# Patient Record
Sex: Male | Born: 2005 | Race: White | Hispanic: No | Marital: Single | Smoking: Never smoker
Health system: Southern US, Community
[De-identification: ages and names within clinical notes are randomized; demographics above are authoritative.]

---
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
## 2024-01-30 IMAGING — DX DG FOOT COMPLETE 3+V*R*
3 series · 3 of 3 positions shown · non-contrast
Comparison: None.

CLINICAL DATA: Rolled foot, pain

EXAM:
RIGHT FOOT COMPLETE - 3+ VIEW

[foot ap]
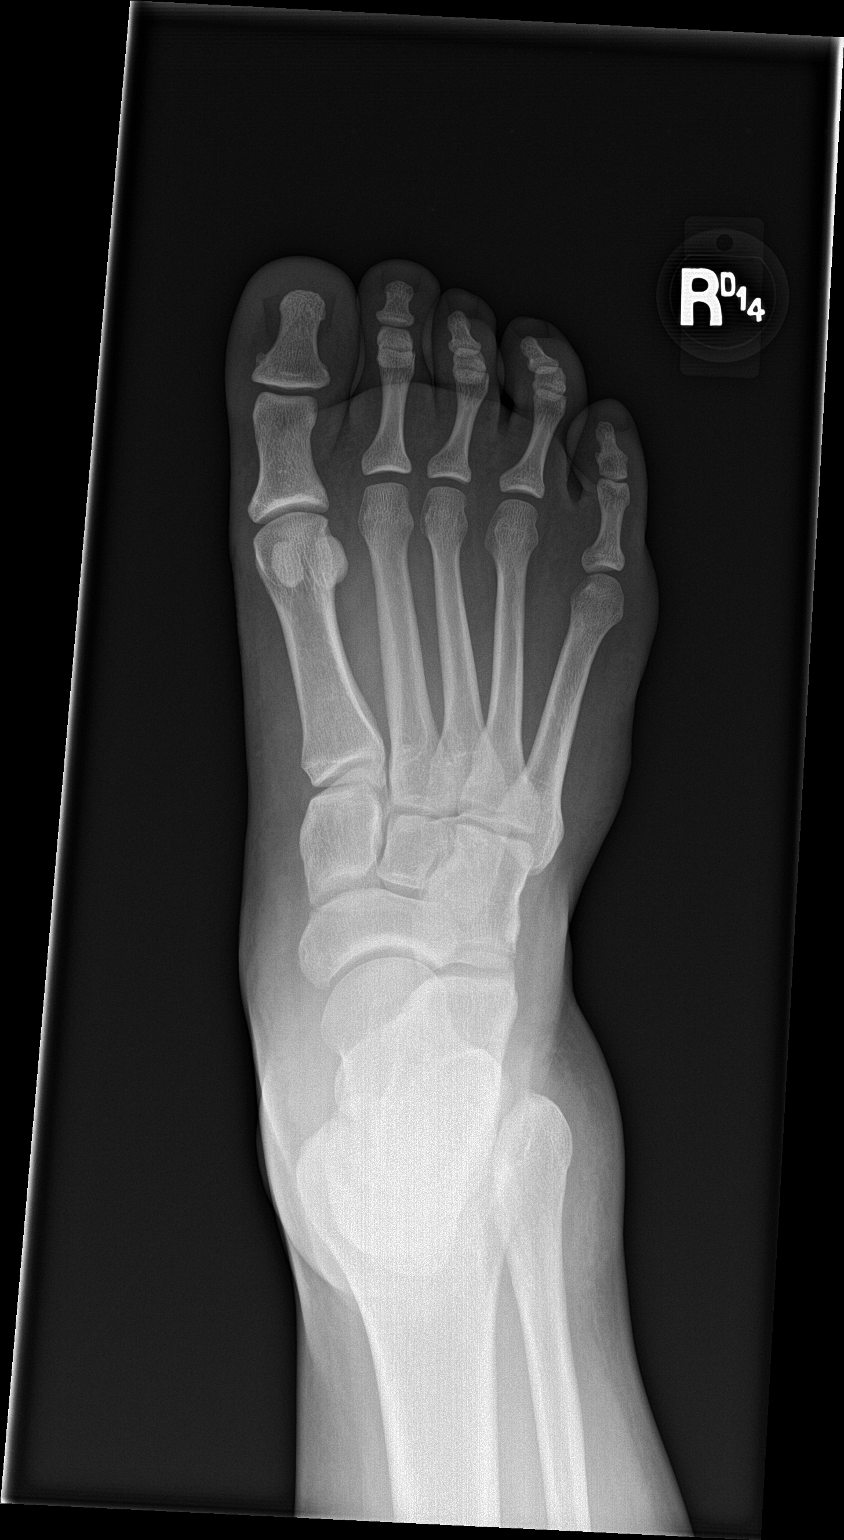

[foot obl]
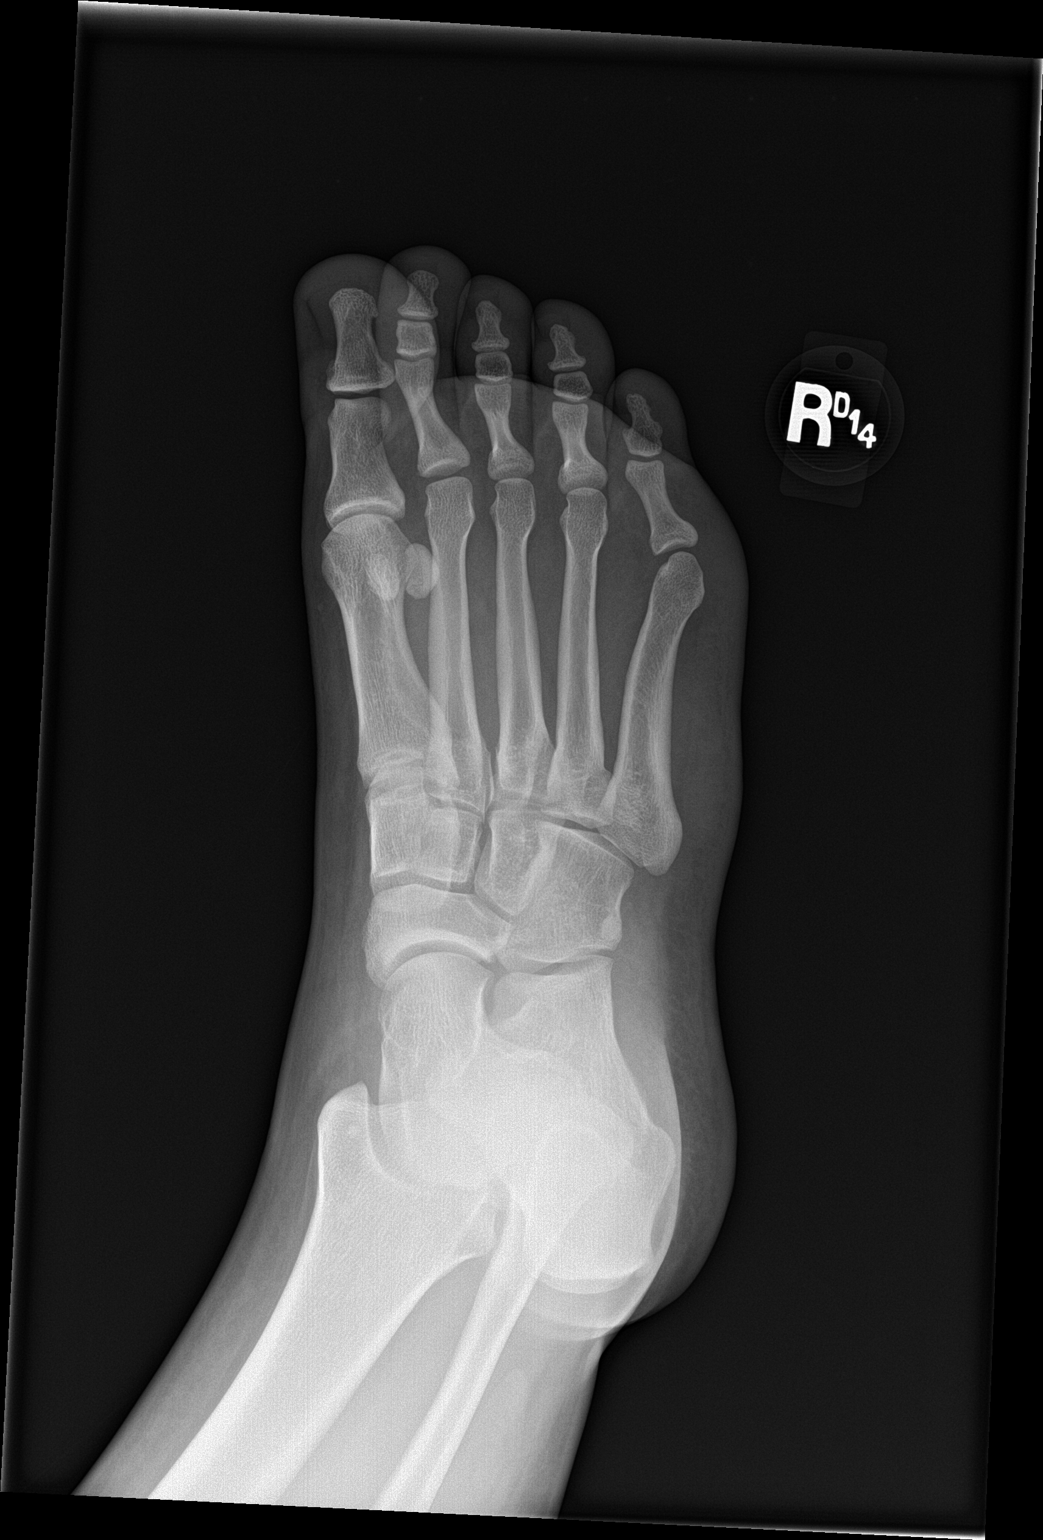

[foot lat]
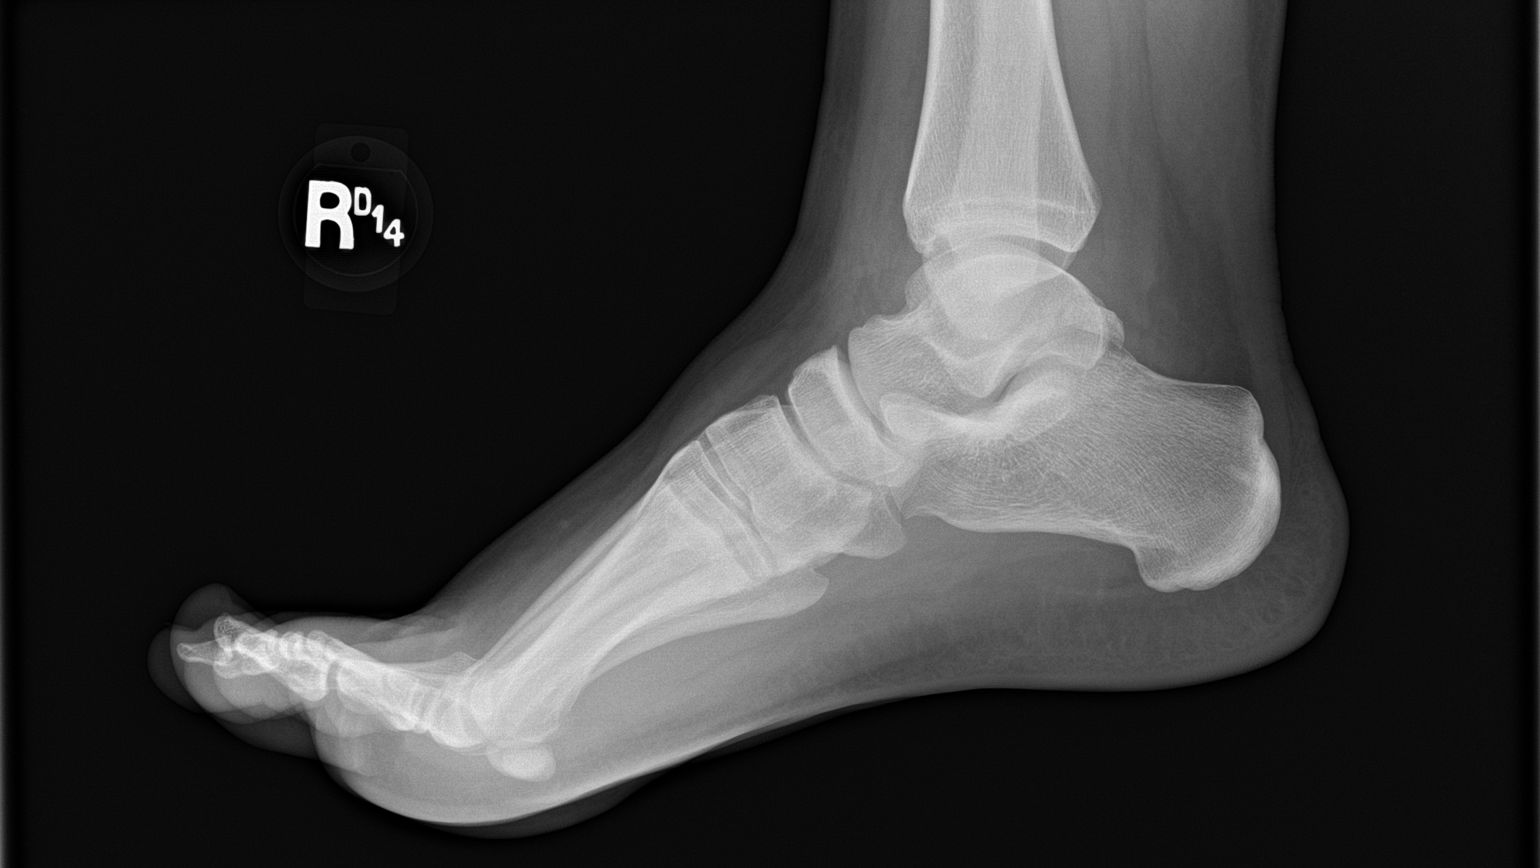

[3 of 3 positions shown; findings below may reference images not displayed]

FINDINGS: There is no evidence of fracture or dislocation. There is no
evidence of arthropathy or other focal bone abnormality. Soft
tissues are unremarkable.
IMPRESSION: No fracture or dislocation of the right foot. Joint spaces are
preserved.
